# Patient Record
Sex: Female | Born: 1965 | Race: Asian | Hispanic: No | Marital: Married | Smoking: Never smoker
Health system: Southern US, Community
[De-identification: ages and names within clinical notes are randomized; demographics above are authoritative.]

## PROBLEM LIST (undated history)

## (undated) DIAGNOSIS — I10 Essential (primary) hypertension: Secondary | ICD-10-CM

---

## 2000-09-20 ENCOUNTER — Emergency Department (HOSPITAL_COMMUNITY): Admission: EM | Admit: 2000-09-20 | Discharge: 2000-09-20 | Payer: Self-pay | Admitting: Emergency Medicine

## 2000-09-28 ENCOUNTER — Inpatient Hospital Stay (HOSPITAL_COMMUNITY): Admission: AD | Admit: 2000-09-28 | Discharge: 2000-09-28 | Payer: Self-pay | Admitting: *Deleted

## 2000-10-28 ENCOUNTER — Encounter: Admission: RE | Admit: 2000-10-28 | Discharge: 2000-10-28 | Payer: Self-pay | Admitting: Family Medicine

## 2000-11-19 ENCOUNTER — Encounter: Admission: RE | Admit: 2000-11-19 | Discharge: 2000-11-19 | Payer: Self-pay | Admitting: Family Medicine

## 2000-11-19 ENCOUNTER — Other Ambulatory Visit: Admission: RE | Admit: 2000-11-19 | Discharge: 2000-11-19 | Payer: Self-pay | Admitting: *Deleted

## 2000-12-27 ENCOUNTER — Encounter: Admission: RE | Admit: 2000-12-27 | Discharge: 2000-12-27 | Payer: Self-pay | Admitting: Family Medicine

## 2001-01-07 ENCOUNTER — Ambulatory Visit (HOSPITAL_COMMUNITY): Admission: RE | Admit: 2001-01-07 | Discharge: 2001-01-07 | Payer: Self-pay

## 2001-01-27 ENCOUNTER — Encounter: Admission: RE | Admit: 2001-01-27 | Discharge: 2001-01-27 | Payer: Self-pay | Admitting: Family Medicine

## 2001-01-31 ENCOUNTER — Encounter: Admission: RE | Admit: 2001-01-31 | Discharge: 2001-01-31 | Payer: Self-pay | Admitting: Family Medicine

## 2001-02-02 ENCOUNTER — Encounter: Admission: RE | Admit: 2001-02-02 | Discharge: 2001-02-02 | Payer: Self-pay | Admitting: Sports Medicine

## 2001-02-14 ENCOUNTER — Encounter: Admission: RE | Admit: 2001-02-14 | Discharge: 2001-02-14 | Payer: Self-pay | Admitting: Family Medicine

## 2001-03-02 ENCOUNTER — Encounter: Admission: RE | Admit: 2001-03-02 | Discharge: 2001-03-02 | Payer: Self-pay | Admitting: Family Medicine

## 2001-03-04 ENCOUNTER — Encounter: Admission: RE | Admit: 2001-03-04 | Discharge: 2001-06-02 | Payer: Self-pay | Admitting: *Deleted

## 2001-03-14 ENCOUNTER — Encounter: Admission: RE | Admit: 2001-03-14 | Discharge: 2001-03-14 | Payer: Self-pay | Admitting: Family Medicine

## 2001-03-30 ENCOUNTER — Encounter: Admission: RE | Admit: 2001-03-30 | Discharge: 2001-03-30 | Payer: Self-pay | Admitting: Family Medicine

## 2001-04-18 ENCOUNTER — Encounter: Admission: RE | Admit: 2001-04-18 | Discharge: 2001-04-18 | Payer: Self-pay | Admitting: Family Medicine

## 2001-04-27 ENCOUNTER — Inpatient Hospital Stay (HOSPITAL_COMMUNITY): Admission: AD | Admit: 2001-04-27 | Discharge: 2001-05-05 | Payer: Self-pay | Admitting: Obstetrics

## 2001-04-27 ENCOUNTER — Encounter: Payer: Self-pay | Admitting: Obstetrics

## 2001-04-27 ENCOUNTER — Encounter: Admission: RE | Admit: 2001-04-27 | Discharge: 2001-04-27 | Payer: Self-pay | Admitting: Family Medicine

## 2001-05-10 ENCOUNTER — Encounter: Admission: RE | Admit: 2001-05-10 | Discharge: 2001-05-10 | Payer: Self-pay | Admitting: Family Medicine

## 2001-05-19 ENCOUNTER — Encounter: Admission: RE | Admit: 2001-05-19 | Discharge: 2001-05-19 | Payer: Self-pay | Admitting: Family Medicine

## 2001-05-26 ENCOUNTER — Encounter: Admission: RE | Admit: 2001-05-26 | Discharge: 2001-05-26 | Payer: Self-pay | Admitting: Family Medicine

## 2001-06-14 ENCOUNTER — Encounter: Admission: RE | Admit: 2001-06-14 | Discharge: 2001-06-14 | Payer: Self-pay | Admitting: Family Medicine

## 2001-06-27 ENCOUNTER — Encounter: Admission: RE | Admit: 2001-06-27 | Discharge: 2001-06-27 | Payer: Self-pay | Admitting: Family Medicine

## 2001-07-18 ENCOUNTER — Encounter: Payer: Self-pay | Admitting: Sports Medicine

## 2001-07-18 ENCOUNTER — Encounter: Admission: RE | Admit: 2001-07-18 | Discharge: 2001-07-18 | Payer: Self-pay | Admitting: Sports Medicine

## 2005-02-23 ENCOUNTER — Emergency Department (HOSPITAL_COMMUNITY): Admission: EM | Admit: 2005-02-23 | Discharge: 2005-02-23 | Payer: Self-pay | Admitting: Family Medicine

## 2005-02-27 ENCOUNTER — Encounter: Admission: RE | Admit: 2005-02-27 | Discharge: 2005-02-27 | Payer: Self-pay | Admitting: Cardiology

## 2006-09-16 ENCOUNTER — Ambulatory Visit (HOSPITAL_COMMUNITY): Admission: RE | Admit: 2006-09-16 | Discharge: 2006-09-16 | Payer: Self-pay | Admitting: Cardiology

## 2006-10-29 ENCOUNTER — Encounter: Admission: RE | Admit: 2006-10-29 | Discharge: 2006-10-29 | Payer: Self-pay | Admitting: Cardiology

## 2007-02-28 ENCOUNTER — Emergency Department (HOSPITAL_COMMUNITY): Admission: EM | Admit: 2007-02-28 | Discharge: 2007-02-28 | Payer: Self-pay | Admitting: Emergency Medicine

## 2007-06-22 ENCOUNTER — Emergency Department (HOSPITAL_COMMUNITY): Admission: EM | Admit: 2007-06-22 | Discharge: 2007-06-22 | Payer: Self-pay | Admitting: Emergency Medicine

## 2010-05-22 ENCOUNTER — Encounter: Admission: RE | Admit: 2010-05-22 | Discharge: 2010-05-22 | Payer: Self-pay | Admitting: Cardiology

## 2011-01-23 NOTE — Discharge Summary (Signed)
[facility]  Patient:    Kristie Walker, Kristie Walker Visit Number: [id number] MRN: [id number]          Service Type: OBS Location: 910A [id number] [id number] Attending Physician:  Tammi Sou Dictated by:   Emelda Fear, M.D. Adm. Date:  04/27/2001 Disc. Date: 05/05/2001                             Discharge Summary  DATE OF BIRTH:                1966/04/21  ADMITTING DIAGNOSES:          1. Intrauterine pregnancy at 37 and [redacted] weeks                                  gestation.                               2. Preeclampsia.                               3. Oligohydramnios.  DISCHARGE DIAGNOSES:          1. Vacuum assisted vaginal delivery.                               2. Preeclampsia.  FELLOW:                       Ed Blalock. Burnadette Peter, M.D.  HOSPITAL COURSE:              [name] is a 45 year old G1, P0 presenting at 67 and 4 weeks for admission secondary to oligohydramnios and preeclampsia. Patient was admitted for cervical ripening and labor augmentation in addition to magnesium sulfate supplementation secondary to her preeclampsia.  Patient was admitted with difficulty in cervical ripening and augmentation.  Following such efforts patient spontaneously ruptured membranes.  Patient was initiated on magnesium sulfate postpartum.  Blood pressures upon admission ranged from 123-170/75-110.  Patient did well postpartum and was taken off of the magnesium sulfate therapy.  Procardia XL 60 mg was initiated for persistent elevated blood pressures during her admission.  Patient was discharged on postpartum day #6.  DISCHARGE MEDICATIONS:        1. Procardia XL 60 mg one tablet p.o. q.d.                               2. Motrin p.r.n. for pain.                               3. Prenatal vitamins one tablet q.d. while                                  breast-feeding.                               4. Lanolin p.r.n. for nipple soreness.  WOUND CARE:  Not applicable.  DIET:                         Regular diet with increased fluids, fiber in the diet.  ACTIVITY:                     No strenuous activity or lifting over 10 pounds. Sexual activity:  Nothing per vagina for the following six weeks.  FOLLOW-UP:                    [facility] in six weeks for a postpartum visit and checkup.  Patient is also to follow-up Tuesday, September 3 at 9:30 p.m. at [facility] with Dr. Mikel Cella for a blood pressure evaluation and potential adjustment of Procardia.  SPECIAL INSTRUCTIONS:         Patient is recommended to call doctor if temperature develops greater than 100.4 with increased vaginal bleeding or with severe pain not adequately controlled with Motrin or Tylenol.  DISPOSITION:                  Discharged to home. Dictated by:   Emelda Fear, M.D. Attending Physician:  Tammi Sou DD:  05/05/01 TD:  05/05/01 Job: [id number]

## 2011-06-24 LAB — I-STAT 8, (EC8 V) (CONVERTED LAB)
Acid-base deficit: 1
BUN: 12
Bicarbonate: 26.1 — ABNORMAL HIGH
Chloride: 106
Glucose, Bld: 105 — ABNORMAL HIGH
HCT: 45
Hemoglobin: 15.3 — ABNORMAL HIGH
Operator id: 116391
Potassium: 4.3
Sodium: 139
TCO2: 28
pCO2, Ven: 48.8
pH, Ven: 7.335 — ABNORMAL HIGH

## 2011-06-24 LAB — POCT I-STAT CREATININE
Creatinine, Ser: 0.6
Operator id: 116391

## 2013-06-27 ENCOUNTER — Other Ambulatory Visit: Payer: Self-pay | Admitting: Cardiovascular Disease

## 2013-06-27 ENCOUNTER — Ambulatory Visit
Admission: RE | Admit: 2013-06-27 | Discharge: 2013-06-27 | Disposition: A | Discharge status: Home / Self Care | Payer: No Typology Code available for payment source | Source: Ambulatory Visit | Attending: Cardiovascular Disease | Admitting: Cardiovascular Disease

## 2013-06-27 DIAGNOSIS — M542 Cervicalgia: Secondary | ICD-10-CM

## 2013-06-27 DIAGNOSIS — M79602 Pain in left arm: Secondary | ICD-10-CM

## 2013-07-28 ENCOUNTER — Other Ambulatory Visit: Payer: Self-pay | Admitting: Cardiovascular Disease

## 2013-07-28 DIAGNOSIS — M5090 Cervical disc disorder, unspecified, unspecified cervical region: Secondary | ICD-10-CM

## 2013-08-08 ENCOUNTER — Other Ambulatory Visit: Payer: No Typology Code available for payment source

## 2014-04-09 ENCOUNTER — Encounter (HOSPITAL_COMMUNITY): Payer: Self-pay | Admitting: Emergency Medicine

## 2014-04-09 ENCOUNTER — Emergency Department (HOSPITAL_COMMUNITY)
Admission: EM | Admit: 2014-04-09 | Discharge: 2014-04-09 | Disposition: A | Discharge status: Home / Self Care | Payer: No Typology Code available for payment source | Attending: Emergency Medicine | Admitting: Emergency Medicine

## 2014-04-09 DIAGNOSIS — M549 Dorsalgia, unspecified: Secondary | ICD-10-CM | POA: Insufficient documentation

## 2014-04-09 DIAGNOSIS — M25512 Pain in left shoulder: Secondary | ICD-10-CM

## 2014-04-09 DIAGNOSIS — M25519 Pain in unspecified shoulder: Secondary | ICD-10-CM | POA: Insufficient documentation

## 2014-04-09 DIAGNOSIS — I10 Essential (primary) hypertension: Secondary | ICD-10-CM | POA: Insufficient documentation

## 2014-04-09 DIAGNOSIS — Z79899 Other long term (current) drug therapy: Secondary | ICD-10-CM | POA: Insufficient documentation

## 2014-04-09 DIAGNOSIS — M79609 Pain in unspecified limb: Secondary | ICD-10-CM | POA: Insufficient documentation

## 2014-04-09 HISTORY — DX: Essential (primary) hypertension: I10

## 2014-04-09 MED ORDER — OXYCODONE-ACETAMINOPHEN 5-325 MG PO TABS: 1.0000 | ORAL_TABLET | ORAL | Status: AC | PRN

## 2014-04-09 MED ORDER — DIAZEPAM 5 MG PO TABS
5.0000 mg | ORAL_TABLET | Freq: Once | ORAL | Status: AC
  Administered 2014-04-09: 5 mg via ORAL
  Filled 2014-04-09: qty 1

## 2014-04-09 MED ORDER — OXYCODONE-ACETAMINOPHEN 5-325 MG PO TABS
1.0000 | ORAL_TABLET | Freq: Once | ORAL | Status: AC
Start: 2014-04-09 — End: 2014-04-09
  Administered 2014-04-09: 1 via ORAL
  Filled 2014-04-09: qty 1

## 2014-04-09 MED ORDER — DIAZEPAM 5 MG PO TABS: 5.0000 mg | ORAL_TABLET | Freq: Two times a day (BID) | ORAL | Status: AC

## 2014-04-09 NOTE — ED Notes (Signed)
Pt c/o upper left back pain with radiation to arm; pt sts feels like muscle spasm and worse with certain positions

## 2014-04-09 NOTE — ED Provider Notes (Signed)
CSN: [id number]     Arrival date & time 04/09/14  1639 History  This chart was scribed for non-physician practitioner, Sharilyn Sites, PA-C, working with Doug Sou, MD by Charline Bills, ED Scribe. This patient was seen in room TR07C/TR07C and the patient's care was started at 6:07 PM.   Chief Complaint  Patient presents with  . Arm Pain  . Back Pain   The history is provided by the patient. No language interpreter was used.   HPI Comments: Kristie Walker is a 48 y.o. female who presents to the Emergency Department complaining of L shoulder pain that has been intermittent for the past several weeks. Pain is exacerbated with lifting her L arm and lying on her left side.  Pt states that she feels like she needs to pop her shoulder and that her muscles are tight. She denies falls, injury, previous injury.  Denies any numbness, paresthesias, or weakness of left arm. No prior left shoulder injuries or surgeries. She has tried a heating pad with temporary relief. Pt states that she has seen her PCP for similar symptoms and was prescribed Flexeril that she is not taking because it makes her too drowsy. Pt is R hand dominant. No h/o heart complications.  Denies current chest pain, SOB, palpitations, dizziness, weakness, recent travel, LE edema, calf pain.  No hx DVT or PE.  No hx IVDU or cancer. VS stable on arrival.  Past Medical History  Diagnosis Date  . Hypertension    History reviewed. No pertinent past surgical history. History reviewed. No pertinent family history. History  Substance Use Topics  . Smoking status: Never Smoker   . Smokeless tobacco: Not on file  . Alcohol Use: No   OB History   Grav Para Term Preterm Abortions TAB SAB Ect Mult Living                 Review of Systems  Musculoskeletal: Positive for back pain and myalgias.  All other systems reviewed and are negative.  Allergies  Review of patient's allergies indicates no known allergies.  Home Medications   Prior  to Admission medications   Medication Sig Start Date End Date Taking? Authorizing Provider  ALPRAZolam Prudy Feeler) 0.25 MG tablet Take 0.25 mg by mouth at bedtime as needed for anxiety.   Yes Historical Provider, MD  Menthol, Topical Analgesic, 5 % PADS Apply 1 application topically daily as needed. For pain   Yes Historical Provider, MD  telmisartan (MICARDIS) 80 MG tablet Take 40 mg by mouth daily.   Yes Historical Provider, MD   Triage Vitals: BP 171/84  Pulse 101  Temp(Src) 98.3 F (36.8 C) (Oral)  Resp 18  SpO2 99% Physical Exam  Nursing note and vitals reviewed. Constitutional: She is oriented to person, place, and time. She appears well-developed and well-nourished.  HENT:  Head: Normocephalic and atraumatic.  Mouth/Throat: Oropharynx is clear and moist.  Eyes: Conjunctivae and EOM are normal. Pupils are equal, round, and reactive to light.  Neck: Normal range of motion.  Cardiovascular: Normal rate, regular rhythm and normal heart sounds.   Pulmonary/Chest: Effort normal and breath sounds normal.  Abdominal: Soft. Bowel sounds are normal.  Musculoskeletal: Normal range of motion.       Left shoulder: She exhibits tenderness. She exhibits no deformity.  L trapezius tender to palpation without noted bony deformities; skin normal in appearance Pain reproducible with ROM of L shoulder, notably with forward flexion and abduction Normal strength throughout LUE, Strong radial pulse  and capillary refill  Neurological: She is alert and oriented to person, place, and time.  Skin: Skin is warm and dry.  Psychiatric: She has a normal mood and affect.   ED Course  Procedures (including critical care time) DIAGNOSTIC STUDIES: Oxygen Saturation is 99% on RA, normal by my interpretation.    COORDINATION OF CARE: 6:12 PM-Discussed treatment plan which includes medication for pain with pt at bedside and pt agreed to plan.   Labs Review Labs Reviewed - No data to display  Imaging  Review No results found.   EKG Interpretation None      MDM   Final diagnoses:  Left shoulder pain   48 year old female with intermittent left shoulder pain over the past several weeks. On exam, pain is reproducible with palpation and range of motion of left shoulder. Arm remains NVI.  Patient has no prior cardiac history and currently denies any chest pain, shortness of breath, palpitations. She is low risk for PE per Well's criteria. Her only cardiac risk factor is hypertension. At this time I have low suspicion for atypical presentation of HTN, PE, dissection, or other acute cardiac event.  Patient was given dose of Percocet and Valium in the emergency department with improvement of symptoms, will discharge home with the same. She will follow with her primary care physician.  Discussed plan with patient, he/she acknowledged understanding and agreed with plan of care.  Return precautions given for new or worsening symptoms.  I personally performed the services described in this documentation, which was scribed in my presence. The recorded information has been reviewed and is accurate.  Garlon HatchetLisa M Tamelia Michalowski, PA-C 04/09/14 2217

## 2014-04-09 NOTE — Discharge Instructions (Signed)
Take the prescribed medication as directed.  May continue applying heat to affected areas to help with pain. Follow-up with your primary care physician. Return to the ED for new or worsening symptoms.

## 2014-04-10 NOTE — ED Provider Notes (Signed)
Medical screening examination/treatment/procedure(s) were performed by non-physician practitioner and as supervising physician I was immediately available for consultation/collaboration.   EKG Interpretation None       Doug SouSam Keia Rask, MD 04/10/14 [id number]

## 2014-04-13 ENCOUNTER — Ambulatory Visit
Admission: RE | Admit: 2014-04-13 | Discharge: 2014-04-13 | Disposition: A | Discharge status: Home / Self Care | Payer: Self-pay | Source: Ambulatory Visit | Attending: Cardiovascular Disease | Admitting: Cardiovascular Disease

## 2014-04-13 ENCOUNTER — Other Ambulatory Visit: Payer: Self-pay | Admitting: Cardiovascular Disease

## 2014-04-13 DIAGNOSIS — M25512 Pain in left shoulder: Secondary | ICD-10-CM

## 2019-09-14 ENCOUNTER — Other Ambulatory Visit: Payer: Self-pay | Admitting: Cardiovascular Disease

## 2019-09-14 ENCOUNTER — Ambulatory Visit
Admission: RE | Admit: 2019-09-14 | Discharge: 2019-09-14 | Disposition: A | Discharge status: Home / Self Care | Payer: No Typology Code available for payment source | Source: Ambulatory Visit | Attending: Cardiovascular Disease | Admitting: Cardiovascular Disease

## 2019-09-14 DIAGNOSIS — R202 Paresthesia of skin: Secondary | ICD-10-CM

## 2019-09-14 DIAGNOSIS — R200 Anesthesia of skin: Secondary | ICD-10-CM

## 2020-02-13 ENCOUNTER — Other Ambulatory Visit: Payer: Self-pay

## 2020-02-13 ENCOUNTER — Ambulatory Visit (HOSPITAL_COMMUNITY)
Admission: EM | Admit: 2020-02-13 | Discharge: 2020-02-13 | Disposition: A | Discharge status: Home / Self Care | Payer: BLUE CROSS/BLUE SHIELD | Attending: Physician Assistant | Admitting: Physician Assistant

## 2020-02-13 ENCOUNTER — Ambulatory Visit (INDEPENDENT_AMBULATORY_CARE_PROVIDER_SITE_OTHER): Payer: BLUE CROSS/BLUE SHIELD

## 2020-02-13 DIAGNOSIS — S92515A Nondisplaced fracture of proximal phalanx of left lesser toe(s), initial encounter for closed fracture: Secondary | ICD-10-CM

## 2020-02-13 DIAGNOSIS — S20212A Contusion of left front wall of thorax, initial encounter: Secondary | ICD-10-CM | POA: Diagnosis not present

## 2020-02-13 NOTE — ED Triage Notes (Signed)
Pt presents to UC after falling at mailbox today. Left foot is bruised at 4th and 5th toes. Pt states she landed on her left side, and has had left sided rib pain since.

## 2020-02-13 NOTE — ED Provider Notes (Signed)
[facility]    CSN: [id number] Arrival date & time: 02/13/20  1927      History   Chief Complaint Chief Complaint  Patient presents with  . Foot Injury    HPI Kristie Walker is a 54 y.o. female.   Patient reports to urgent care for evaluation of left foot injury and left-sided rib pain.  She reports earlier today around 1 PM she tripped and rolled her left foot and fell backwards onto her couch.  After this she had pain in her fourth and fifth toes and in the foot just before the fourth and fifth toes.  She noticed bruising.  It is painful to walk.  She reports this is pain up to 8 out of 10.  She also reports some left-sided upper rib pain.  She reports she fell onto the ribs onto the couch.  She reports this pain is not severe.  Denies difficulty breathing.  Denies pain with breathing.  She denies other injuries.  Denies hitting her head.     Past Medical History:  Diagnosis Date  . Hypertension     There are no problems to display for this patient.   No past surgical history on file.  OB History   No obstetric history on file.      Home Medications    Prior to Admission medications   Medication Sig Start Date End Date Taking? Authorizing Provider  ALPRAZolam Prudy Feeler) 0.25 MG tablet Take 0.25 mg by mouth at bedtime as needed for anxiety.    [provider]  diazepam (VALIUM) 5 MG tablet Take 1 tablet (5 mg total) by mouth 2 (two) times daily. 04/09/14   Garlon Hatchet, PA-C  Menthol, Topical Analgesic, 5 % PADS Apply 1 application topically daily as needed. For pain    [provider]  oxyCODONE-acetaminophen (PERCOCET/ROXICET) 5-325 MG per tablet Take 1 tablet by mouth every 4 (four) hours as needed. 04/09/14   Garlon Hatchet, PA-C  telmisartan (MICARDIS) 80 MG tablet Take 40 mg by mouth daily.    [provider]    Family History No family history on file.  Social History Social History   Tobacco Use  . Smoking status:  Never Smoker  Substance Use Topics  . Alcohol use: No  . Drug use: No     Allergies   Patient has no known allergies.   Review of Systems Review of Systems   Physical Exam Triage Vital Signs ED Triage Vitals [02/13/20 1938]  Enc Vitals Group     BP 122/67     Pulse Rate 67     Resp 18     Temp 98.2 F (36.8 C)     Temp Source Tympanic     SpO2 100 %     Weight      Height      Head Circumference      Peak Flow      Pain Score 8     Pain Loc      Pain Edu?      Excl. in GC?    No data found.  Updated Vital Signs BP 122/67 (BP Location: Right Arm)   Pulse 67   Temp 98.2 F (36.8 C) (Tympanic)   Resp 18   LMP 06/06/2013   SpO2 100%   Visual Acuity Right Eye Distance:   Left Eye Distance:   Bilateral Distance:    Right Eye Near:   Left Eye Near:  Bilateral Near:     Physical Exam Vitals and nursing note reviewed.  Constitutional:      General: She is not in acute distress.    Appearance: She is well-developed.  HENT:     Head: Normocephalic and atraumatic.  Eyes:     Conjunctiva/sclera: Conjunctivae normal.  Cardiovascular:     Rate and Rhythm: Normal rate.     Heart sounds: No murmur.  Pulmonary:     Effort: Pulmonary effort is normal.     Comments: There is some tenderness in the left upper mid axillary region.  No pain with thoracic compression.  No pain elicited with deep inspiration.  No rashes or bruising or axillary area Musculoskeletal:     Cervical back: Neck supple.     Comments: Left foot: No obvious deformity.  Ecchymosis of the fourth and fifth digit.  There is ecchymosis the distal aspect of the fourth and fifth metatarsals.  There is tenderness over the distal fourth and fifth metatarsals.  Patient is able to move the toes.  Significant pain with weightbearing.  No proximal foot tenderness or base of fifth tenderness.  Cap refill less than 2 seconds.  Skin:    General: Skin is warm and dry.  Neurological:     Mental Status: She  is alert.      UC Treatments / Results  Labs (all labs ordered are listed, but only abnormal results are displayed) Labs Reviewed - No data to display  EKG   Radiology DG Foot Complete Left  Result Date: 02/13/2020 CLINICAL DATA:  Left foot pain after a fall with a twisting injury today. Initial encounter. EXAM: LEFT FOOT - COMPLETE 3+ VIEW COMPARISON:  None. FINDINGS: There is a transverse fracture through the proximal metaphysis of the proximal phalanx of the little toe. The fracture is nondisplaced. Imaged bones otherwise appear normal. Soft tissues are unremarkable. IMPRESSION: Acute, nondisplaced fracture proximal metaphysis of the proximal phalanx of the little toe. Electronically Signed   By: Drusilla Kanner M.D.   On: 02/13/2020 20:17    Procedures Procedures (including critical care time)  Medications Ordered in UC Medications - No data to display  Initial Impression / Assessment and Plan / UC Course  I have reviewed the triage vital signs and the nursing notes.  Pertinent labs & imaging results that were available during my care of the patient were reviewed by me and considered in my medical decision making (see chart for details).     #Left fifth digit fracture #Rib contusion Patient is a 54 year old presenting with nondisplaced closed fracture of the proximal phalanx of the fifth digit on the left foot and rib contusion.  Low suspicion for rib fracture.  Neurovascularly intact in the foot.  Will place in walking boot/postop shoe.  Appears patient's primary care is a cardiologist will have her follow-up with sports medicine for reevaluation in about 1 week.  Discussed use of postop boot to ensure lack of flexion extension of the toe.  Discussed ice and elevation tonight and tomorrow.  Ibuprofen and Tylenol for pain relief.  Return follow-up precautions were discussed.  Patient verbalized understanding Final Clinical Impressions(s) / UC Diagnoses   Final diagnoses:    Closed nondisplaced fracture of proximal phalanx of lesser toe of left foot, initial encounter  Contusion of rib on left side, initial encounter     Discharge Instructions     Wear the boot supplied, you may cover this to meet work requirements -you may also wear hard/stiff  soled shoes and tape your little toe and your next toe. - ice and elevate the foot tonight and tomorrow  Follow up with either your primary doctor or the sports medicine group at your preference, but you should have re-evaluation in about 7 days  Take tylenol and ibuprofen for pain, 2 regular strength every 6-8 hours  If you develop severe rib pain or shortness of breath return      ED Prescriptions    None     PDMP not reviewed this encounter.   Purnell Shoemaker, PA-C 02/13/20 2050

## 2020-02-13 NOTE — Discharge Instructions (Addendum)
Wear the boot supplied, you may cover this to meet work requirements -you may also wear hard/stiff soled shoes and tape your little toe and your next toe. - ice and elevate the foot tonight and tomorrow  Follow up with either your primary doctor or the sports medicine group at your preference, but you should have re-evaluation in about 7 days  Take tylenol and ibuprofen for pain, 2 regular strength every 6-8 hours  If you develop severe rib pain or shortness of breath return

## 2021-02-13 IMAGING — DX DG FOOT COMPLETE 3+V*L*
3 series · 3 of 3 positions shown · non-contrast
Comparison: None.

CLINICAL DATA: Left foot pain after a fall with a twisting injury
today. Initial encounter.

EXAM:
LEFT FOOT - COMPLETE 3+ VIEW

[foot ap]
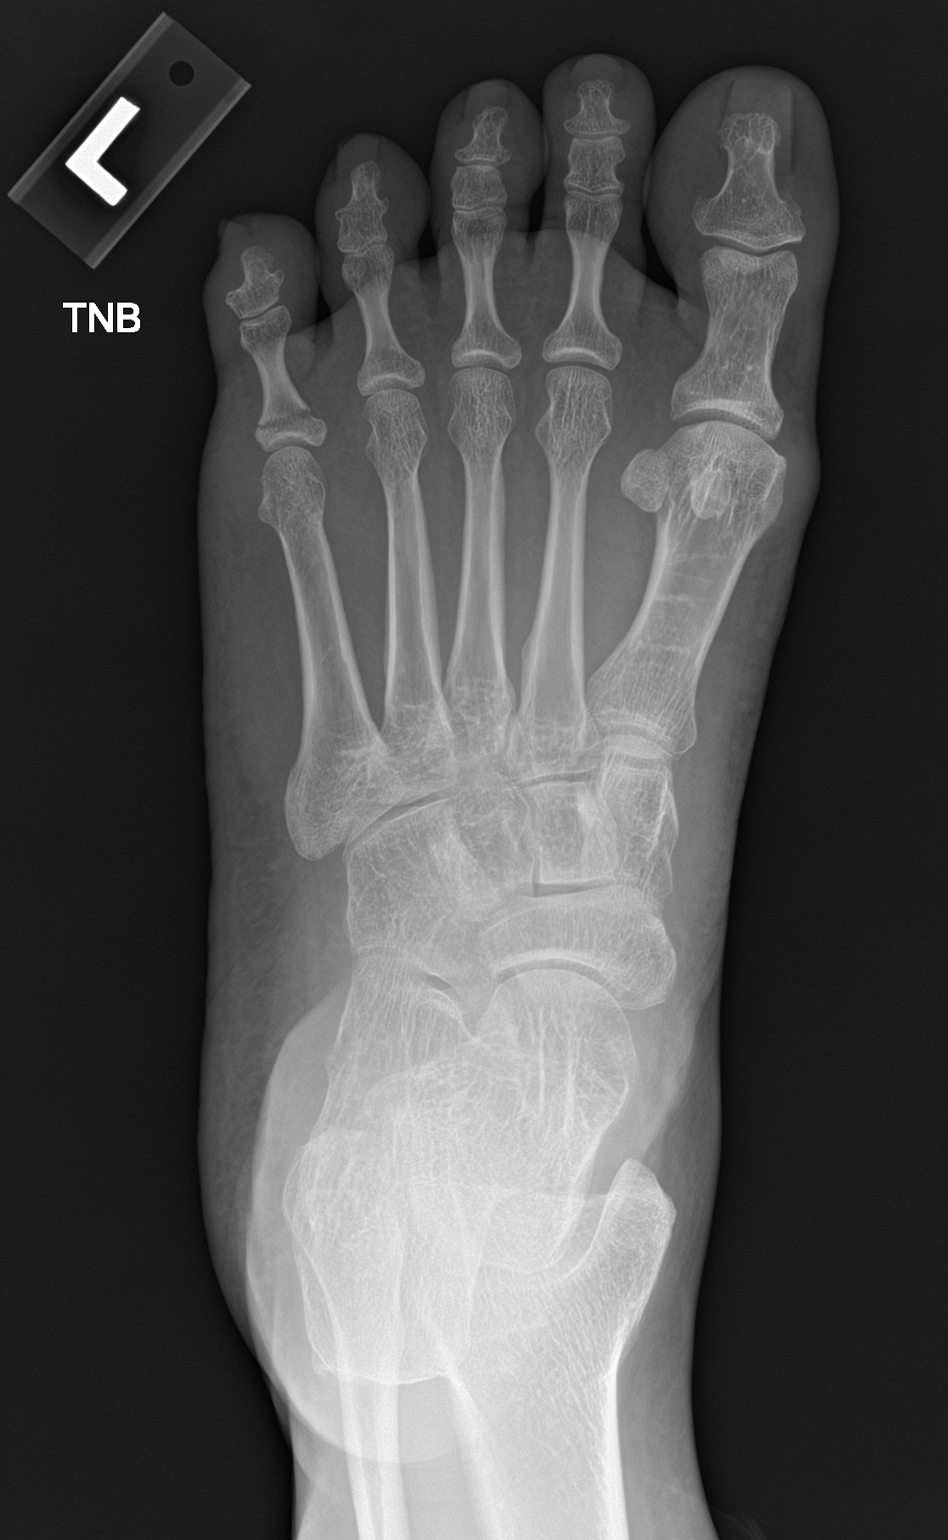

[foot obl]
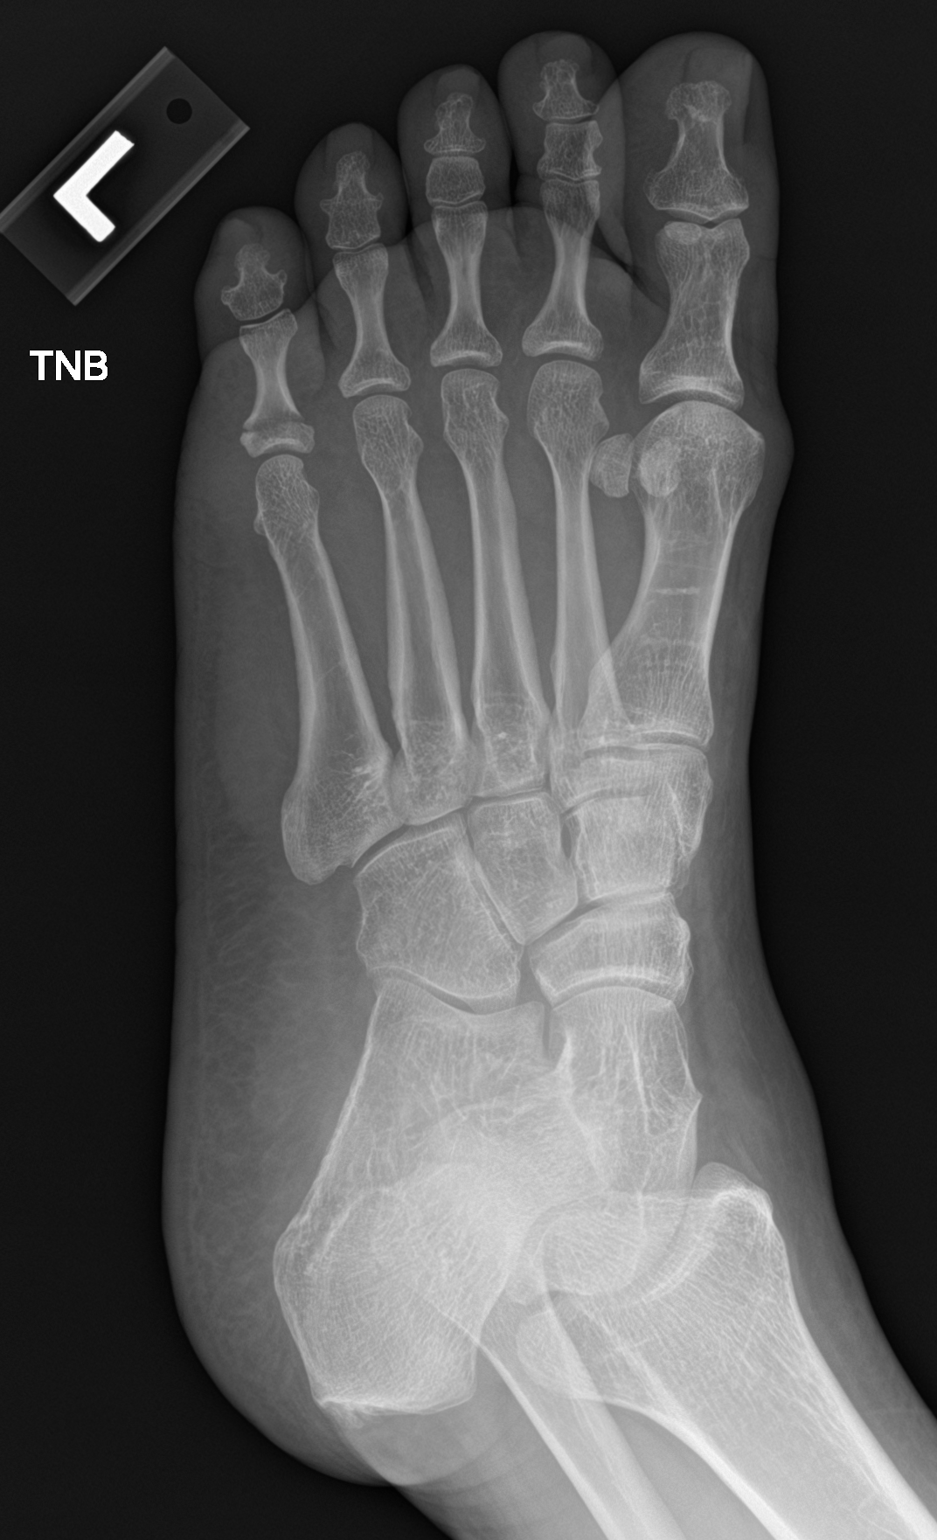

[foot lat]
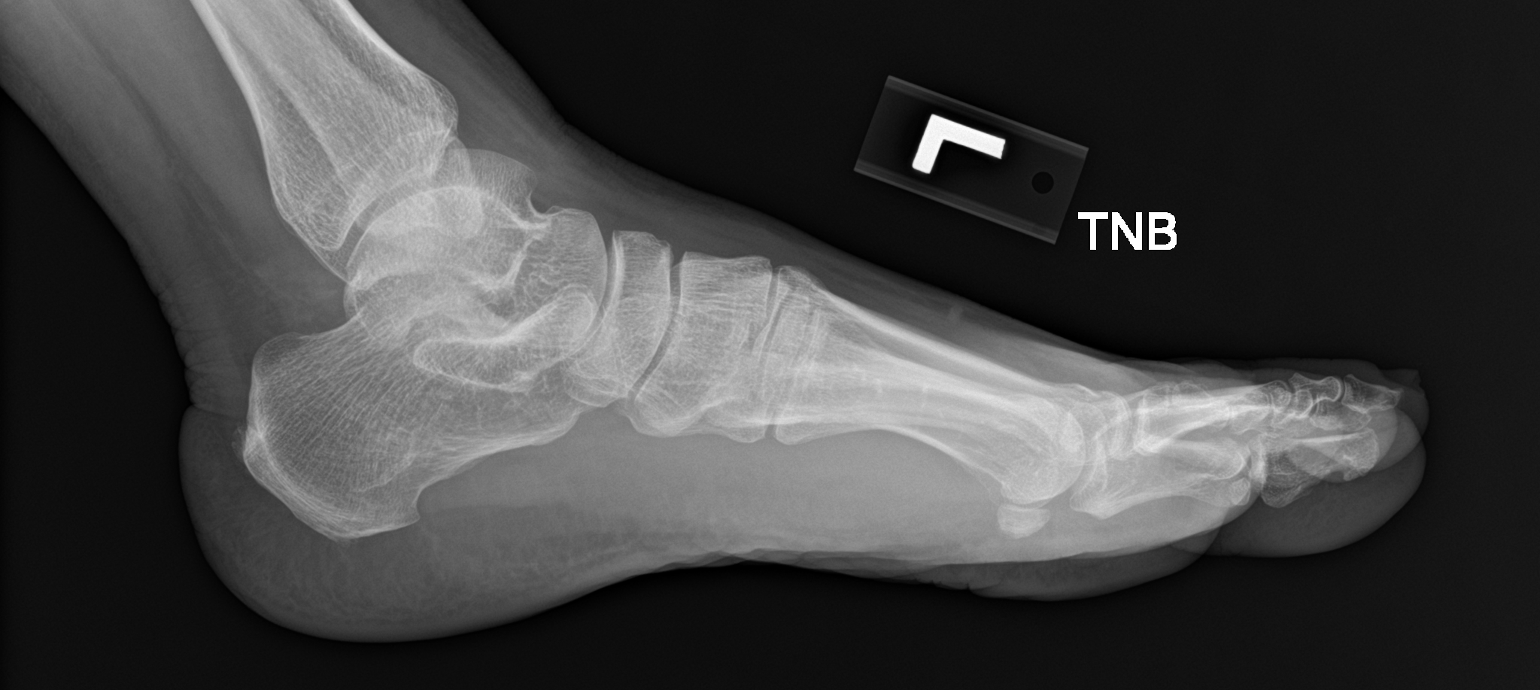

[3 of 3 positions shown; findings below may reference images not displayed]

FINDINGS: There is a transverse fracture through the proximal metaphysis of
the proximal phalanx of the little toe. The fracture is
nondisplaced. Imaged bones otherwise appear normal. Soft tissues are
unremarkable.
IMPRESSION: Acute, nondisplaced fracture proximal metaphysis of the proximal
phalanx of the little toe.

## 2024-08-15 ENCOUNTER — Other Ambulatory Visit: Payer: Self-pay

## 2024-08-15 ENCOUNTER — Emergency Department (HOSPITAL_COMMUNITY)
Admission: EM | Admit: 2024-08-15 | Discharge: 2024-08-16 | Disposition: A | Attending: Emergency Medicine | Admitting: Emergency Medicine

## 2024-08-15 LAB — BASIC METABOLIC PANEL WITH GFR
Anion gap: 10 (ref 5–15)
BUN: 7 mg/dL (ref 6–20)
CO2: 25 mmol/L (ref 22–32)
Calcium: 9.2 mg/dL (ref 8.9–10.3)
Chloride: 106 mmol/L (ref 98–111)
Creatinine, Ser: 0.68 mg/dL (ref 0.44–1.00)
GFR, Estimated: >60 mL/min (ref >60)
Glucose, Bld: 126 mg/dL — ABNORMAL HIGH (ref 70–99)
Potassium: 3.6 mmol/L (ref 3.5–5.1)
Sodium: 141 mmol/L (ref 135–145)

## 2024-08-15 LAB — CBC
HCT: 40.7 % (ref 36.0–46.0)
Hemoglobin: 13.2 g/dL (ref 12.0–15.0)
MCH: 26.3 pg (ref 26.0–34.0)
MCHC: 32.4 g/dL (ref 30.0–36.0)
MCV: 81.1 fL (ref 80.0–100.0)
Platelets: 220 K/uL (ref 150–400)
RBC: 5.02 MIL/uL (ref 3.87–5.11)
RDW: 13.2 % (ref 11.5–15.5)
WBC: 6.5 K/uL (ref 4.0–10.5)
nRBC: 0 % (ref 0.0–0.2)

## 2024-08-15 NOTE — ED Triage Notes (Addendum)
 Pt reports BP 200s systolic at home. 2 days ago pt also started having headache and neck pain radiating  down to the left flank. Denies Cp and N/V

## 2024-08-15 NOTE — ED Triage Notes (Signed)
 Pt c/o hypertension and neck pain x 3 days, states that her BP was 200/88 at home.

## 2024-08-16 NOTE — ED Provider Notes (Signed)
  EMERGENCY DEPARTMENT AT [facility] Provider Note  CSN: [id number] Arrival date & time: 08/15/24 2231  Chief Complaint(s) Hypertension  HPI & MDM Kristie Walker is a 58 y.o. female here for right-sided neck pain and lower back pain.  Intermittent in nature.  Worse with certain movements and palpation.  Noted her blood pressures were elevated with systolics in the 180s to 190s.  She reports that she has missed 2 of her blood pressure medicines over the past month.  She reports that one of them was filled 2 weeks ago and she just received her amlodipine yesterday.  Patient denies any overt headache.  No chest pain or shortness of breath.  No abdominal pain.  No other physical complaints.  The history is provided by the patient.  Hypertension    Clinical Course as of 08/16/24 0533  Wed Aug 16, 2024  0529 Elevated blood pressures in the setting of missed doses.  Right-sided neck and lower back pain are intermittent and consistent with muscle strain.  No headache concerning for ICH.  Patient denies any chest pain or shortness of breath concerning for cardiac endorgan damage.  EKG was reassuring as well.  CBC without leukocytosis or anemia.  Metabolic panel without electrolyte derangements or renal sufficiency.  Patient's blood pressure trended down after she took her nighttime dose of amlodipine.  She currently denies any neck or back pain concerning for any serious etiologies requiring additional workup at this time. [PC]    Clinical Course User Index [PC] Marlyn Rabine, Raynell Moder, MD   Medical Decision Making Amount and/or Complexity of Data Reviewed Labs: ordered. Decision-making details documented in ED Course. ECG/medicine tests: ordered and independent interpretation performed. Decision-making details documented in ED Course.    Final Clinical Impression(s) / ED Diagnoses Final diagnoses:  Elevated blood pressure reading  Muscle strain   The patient  appears reasonably screened and/or stabilized for discharge and I doubt any other medical condition or other [facility] requiring further screening, evaluation, or treatment in the ED at this time. I have discussed the findings, Dx and Tx plan with the patient/family who expressed understanding and agree(s) with the plan. Discharge instructions discussed at length. The patient/family was given strict return precautions who verbalized understanding of the instructions. No further questions at time of discharge.  Disposition: Discharge  Condition: Good  ED Discharge Orders     None       Follow Up: Claudene Pacific, MD [address] [place] [address] [phone]  Call  to schedule an appointment for close follow up     Past Medical History Past Medical History:  Diagnosis Date   Hypertension    There are no active problems to display for this patient.  Home Medication(s) Prior to Admission medications   Medication Sig Start Date End Date Taking? Authorizing Provider  ALPRAZolam (XANAX) 0.25 MG tablet Take 0.25 mg by mouth at bedtime as needed for anxiety.    [provider]  diazepam  (VALIUM ) 5 MG tablet Take 1 tablet (5 mg total) by mouth 2 (two) times daily. 04/09/14   Jarold Olam HERO, PA-C  Menthol, Topical Analgesic, 5 % PADS Apply 1 application topically daily as needed. For pain    [provider]  oxyCODONE -acetaminophen  (PERCOCET/ROXICET) 5-325 MG per tablet Take 1 tablet by mouth every 4 (four) hours as needed. 04/09/14   Jarold Olam HERO, PA-C  telmisartan (MICARDIS) 80 MG tablet Take 40 mg by mouth daily.    [provider]                                                                                                                                    Allergies Patient has no known allergies.  Review of Systems Review of Systems As noted in HPI  Physical Exam Vital Signs  I have reviewed the triage vital signs BP (!) 161/85   Pulse 63    Temp 98.1 F (36.7 C)   Resp 16   Ht 5' (1.524 m)   Wt 68 kg   LMP 06/06/2013   SpO2 100%   BMI 29.29 kg/m   Physical Exam Vitals reviewed.  Constitutional:      General: She is not in acute distress.    Appearance: She is well-developed. She is not diaphoretic.  HENT:     Head: Normocephalic and atraumatic.     Right Ear: External ear normal.     Left Ear: External ear normal.     Nose: Nose normal.  Eyes:     General: No scleral icterus.    Conjunctiva/sclera: Conjunctivae normal.  Neck:     Trachea: Phonation normal.  Cardiovascular:     Rate and Rhythm: Normal rate and regular rhythm.  Pulmonary:     Effort: Pulmonary effort is normal. No respiratory distress.     Breath sounds: No stridor.  Abdominal:     General: There is no distension.  Musculoskeletal:        General: Normal range of motion.     Cervical back: Normal range of motion.  Neurological:     Mental Status: She is alert and oriented to person, place, and time.  Psychiatric:        Behavior: Behavior normal.     ED Results and Treatments Labs (all labs ordered are listed, but only abnormal results are displayed) Labs Reviewed  BASIC METABOLIC PANEL WITH GFR - Abnormal; Notable for the following components:      Result Value   Glucose, Bld 126 (*)    All other components within normal limits  CBC                                                                                                                         EKG  EKG Interpretation Date/Time:  Tuesday August 15 2024 23:16:35 EST Ventricular Rate:  73 PR Interval:  152 QRS Duration:  82  QT Interval:  428 QTC Calculation: 471 R Axis:   70  Text Interpretation: Normal sinus rhythm Cannot rule out Anterior infarct , age undetermined Abnormal ECG No previous ECGs available Confirmed by Raford Lenis ([id number]) on 08/15/2024 11:22:43 PM       Radiology No results found.  Medications Ordered in ED Medications - No data to  display Procedures Procedures  (including critical care time)   This chart was dictated using voice recognition software.  Despite best efforts to proofread,  errors can occur which can change the documentation meaning.   Trine Raynell Moder, MD 08/16/24 [phone]

## 2024-08-16 NOTE — ED Notes (Signed)
 Patient articulated that she has not taken her antihypertensive medications for several weeks .

## 2024-08-21 ENCOUNTER — Emergency Department (HOSPITAL_COMMUNITY)
Admission: EM | Admit: 2024-08-21 | Discharge: 2024-08-21 | Disposition: A | Discharge status: Home / Self Care | Attending: Emergency Medicine | Admitting: Emergency Medicine

## 2024-08-21 ENCOUNTER — Encounter (HOSPITAL_COMMUNITY): Payer: Self-pay

## 2024-08-21 ENCOUNTER — Other Ambulatory Visit: Payer: Self-pay

## 2024-08-21 DIAGNOSIS — Z79899 Other long term (current) drug therapy: Secondary | ICD-10-CM | POA: Diagnosis not present

## 2024-08-21 DIAGNOSIS — Z760 Encounter for issue of repeat prescription: Secondary | ICD-10-CM | POA: Diagnosis not present

## 2024-08-21 DIAGNOSIS — I10 Essential (primary) hypertension: Secondary | ICD-10-CM

## 2024-08-21 LAB — CBC WITH DIFFERENTIAL/PLATELET
Abs Immature Granulocytes: 0.02 K/uL (ref 0.00–0.07)
Basophils Absolute: 0 K/uL (ref 0.0–0.1)
Basophils Relative: 1 %
Eosinophils Absolute: 0.3 K/uL (ref 0.0–0.5)
Eosinophils Relative: 5 %
HCT: 42.1 % (ref 36.0–46.0)
Hemoglobin: 13.7 g/dL (ref 12.0–15.0)
Immature Granulocytes: 0 %
Lymphocytes Relative: 23 %
Lymphs Abs: 1.4 K/uL (ref 0.7–4.0)
MCH: 26.1 pg (ref 26.0–34.0)
MCHC: 32.5 g/dL (ref 30.0–36.0)
MCV: 80.3 fL (ref 80.0–100.0)
Monocytes Absolute: 0.3 K/uL (ref 0.1–1.0)
Monocytes Relative: 6 %
Neutro Abs: 4.1 K/uL (ref 1.7–7.7)
Neutrophils Relative %: 65 %
Platelets: 264 K/uL (ref 150–400)
RBC: 5.24 MIL/uL — ABNORMAL HIGH (ref 3.87–5.11)
RDW: 13.2 % (ref 11.5–15.5)
WBC: 6.2 K/uL (ref 4.0–10.5)
nRBC: 0 % (ref 0.0–0.2)

## 2024-08-21 LAB — COMPREHENSIVE METABOLIC PANEL WITH GFR
ALT: 17 U/L (ref 0–44)
AST: 24 U/L (ref 15–41)
Albumin: 4.1 g/dL (ref 3.5–5.0)
Alkaline Phosphatase: 78 U/L (ref 38–126)
Anion gap: 11 (ref 5–15)
BUN: 11 mg/dL (ref 6–20)
CO2: 24 mmol/L (ref 22–32)
Calcium: 9.7 mg/dL (ref 8.9–10.3)
Chloride: 106 mmol/L (ref 98–111)
Creatinine, Ser: 0.66 mg/dL (ref 0.44–1.00)
GFR, Estimated: >60 mL/min (ref >60)
Glucose, Bld: 123 mg/dL — ABNORMAL HIGH (ref 70–99)
Potassium: 3.9 mmol/L (ref 3.5–5.1)
Sodium: 141 mmol/L (ref 135–145)
Total Bilirubin: 1 mg/dL (ref 0.0–1.2)
Total Protein: 7.6 g/dL (ref 6.5–8.1)

## 2024-08-21 MED ORDER — METOPROLOL SUCCINATE ER 50 MG PO TB24: 50.0000 mg | ORAL_TABLET | Freq: Every day | ORAL | 0 refills | Status: AC

## 2024-08-21 NOTE — ED Provider Notes (Signed)
 [place] EMERGENCY DEPARTMENT AT [facility] Provider Note   CSN: [id number] Arrival date & time: 08/21/24  9081     Patient presents with: Hypertension and Medication Refill   Kristie Walker is a 58 y.o. female who presents to the emergency department with a chief complaint of high blood pressure.  Patient was recently seen for same last week.  At that time patient states that she had missed some of the doses of her blood pressure medication and was also diagnosed with a muscle strain in her neck.  At this time patient had no acute finding concerning for acute intracranial process related to hypertension.  Labs were also reassuring.  Per chart review patient's blood pressure trended down after she took her nighttime dose of amlodipine.  Patient presents for reevaluation today, stating that she has attempted to follow-up with her primary care provider since her last discharge with no luck.  Patient states that she has remained compliant with her blood pressure medications however her blood pressure has remained elevated.  Patient currently denies any other symptoms, denies headache, vision changes, issues with balance or coordination.  Patient states that she did have a mild headache this morning however this resolved when taking Tylenol .  Past medical history significant for hypertension on 50 mg of losartan, 5 mg of amlodipine, 25 mg of metoprolol .  Patient denies chest pain.    Hypertension  Medication Refill      Prior to Admission medications  Medication Sig Start Date End Date Taking? Authorizing Provider  metoprolol  succinate (TOPROL -XL) 50 MG 24 hr tablet Take 1 tablet (50 mg total) by mouth daily. 08/21/24  Yes Terricka Onofrio F, PA-C  ALPRAZolam (XANAX) 0.25 MG tablet Take 0.25 mg by mouth at bedtime as needed for anxiety.    [provider]  diazepam  (VALIUM ) 5 MG tablet Take 1 tablet (5 mg total) by mouth 2 (two) times daily. 04/09/14   Jarold Olam HERO, PA-C   Menthol, Topical Analgesic, 5 % PADS Apply 1 application topically daily as needed. For pain    [provider]  oxyCODONE -acetaminophen  (PERCOCET/ROXICET) 5-325 MG per tablet Take 1 tablet by mouth every 4 (four) hours as needed. 04/09/14   Jarold Olam HERO, PA-C  telmisartan (MICARDIS) 80 MG tablet Take 40 mg by mouth daily.    [provider]    Allergies: Patient has no known allergies.    Review of Systems  Constitutional:        Hypertension    Updated Vital Signs BP (!) 168/79   Pulse 70   Temp 98.3 F (36.8 C) (Oral)   Resp 18   LMP 06/06/2013   SpO2 100%   Physical Exam Vitals and nursing note reviewed.  Constitutional:      General: She is awake. She is not in acute distress.    Appearance: Normal appearance. She is not ill-appearing, toxic-appearing or diaphoretic.  HENT:     Head: Normocephalic and atraumatic.  Eyes:     General: No visual field deficit or scleral icterus.    Extraocular Movements: Extraocular movements intact.     Conjunctiva/sclera: Conjunctivae normal.     Pupils: Pupils are equal, round, and reactive to light.  Neck:     Comments: Patient able to look left, right, touch chin to chest, and look up at the ceiling without significant discomfort Cardiovascular:     Rate and Rhythm: Normal rate and regular rhythm.  Pulmonary:     Effort: Pulmonary effort  is normal. No respiratory distress.     Breath sounds: No stridor. No wheezing, rhonchi or rales.  Musculoskeletal:        General: Normal range of motion.     Cervical back: Normal range of motion.     Right lower leg: No edema.     Left lower leg: No edema.  Skin:    General: Skin is warm.     Capillary Refill: Capillary refill takes less than 2 seconds.  Neurological:     General: No focal deficit present.     Mental Status: She is alert and oriented to person, place, and time.     GCS: GCS eye subscore is 4. GCS verbal subscore is 5. GCS motor subscore is 6.      Cranial Nerves: No dysarthria or facial asymmetry.     Sensory: Sensation is intact.     Motor: Motor function is intact.     Coordination: Coordination is intact.     Gait: Gait normal.  Psychiatric:        Mood and Affect: Mood normal.        Behavior: Behavior normal. Behavior is cooperative.     (all labs ordered are listed, but only abnormal results are displayed) Labs Reviewed  CBC WITH DIFFERENTIAL/PLATELET - Abnormal; Notable for the following components:      Result Value   RBC 5.24 (*)    All other components within normal limits  COMPREHENSIVE METABOLIC PANEL WITH GFR - Abnormal; Notable for the following components:   Glucose, Bld 123 (*)    All other components within normal limits    EKG: None  *EKG interpreted by myself with no acute ischemic changes or ST segment elevation*  Radiology: No results found.   Procedures   Medications Ordered in the ED - No data to display                                  Medical Decision Making Amount and/or Complexity of Data Reviewed Labs: ordered.  Risk Prescription drug management.   Patient presents to the ED for concern of hypertension, this involves an extensive number of treatment options, and is a complaint that carries with it a high risk of complications and morbidity.  The differential diagnosis includes hypertensive urgency, hypertensive emergency, primary hypertension, etc.   Co morbidities that complicate the patient evaluation  Hypertension   Additional history obtained:  Note reviewed from previous emergency department encounter on 08/15/2024 where patient was treated for neck muscle strain and was also noted to be hypertensive at this time as well   Lab Tests:  I Ordered, and personally interpreted labs.  The pertinent results include: CBC unremarkable, CMP unremarkable   Medicines ordered and prescription drug management:  I have reviewed the patients home medicines and have made  adjustments as needed   Test Considered:  CT head: Deferred at this time as patient has no headache, no neurologic symptoms, no focal deficit on neurologic examination, doubt acute intracranial process based off of history and physical exam   Critical Interventions:  None   Problem List / ED Course:  58 year old female, vital signs stable, presents to the emergency department with chief complaint of ongoing hypertension, patient has had trouble following up with her primary care provider Currently on losartan 50 mg, amlodipine 5 mg, metoprolol  25 mg, patient has been compliant with her outpatient medication regimen Had a slight  headache this morning but currently asymptomatic On physical exam patient asymptomatic with no focal neurodeficit, no headache currently Clinically I see no indication for head imaging at this time as patient is asymptomatic with hypertension without headache Will obtain general labs to assess for endorgan damage and also assess liver and kidney function Labs today reassuring Extensive discussion with this patient including that blood pressure is a variable thing and will increase and decrease with pain, stress, as well as other factors Reviewed patient's medications, instructed her the importance of following with her with her primary care provider, will increase metoprolol  from 25 mg to 50 mg today as patient is hypertensive on multiple blood pressure readings Return precautions given Patient discharged Most likely diagnosis at this time is asymptomatic hypertension, patient medication reviewed and increased, encouraged outpatient follow-up and continued monitoring of symptoms   Reevaluation:  After the interventions noted above, I reevaluated the patient and found that they have :stayed the same   Social Determinants of Health:  none   Dispostion:  After consideration of the diagnostic results and the patients response to treatment, I feel that the  patent would benefit from discharge and outpatient therapy as described, follow-up with primary care provider within the next 1 to 2 weeks.  Continue monitoring symptoms..       Final diagnoses:  Hypertension, unspecified type    ED Discharge Orders          Ordered    metoprolol  succinate (TOPROL -XL) 50 MG 24 hr tablet  Daily        08/21/24 [address] Rodolph Hagemann F, PA-C 08/21/24 1604    Melvenia Motto, MD 08/22/24 1556

## 2024-08-21 NOTE — ED Triage Notes (Addendum)
 Pt states her blood pressure has been high at home, was seen here last week for the same. States she tried to follow up with PCP but his office was closed and no one answering phones. Pt is compliant with prescribed medications including Losartan Amlodipine Metoprolol . Pt c/o slight headache this morning relieved by Motrin but no other pain or complaints. Pt states she has been taking these medications for over 20 years and BP has been controlled but they are no longer working right for her.

## 2024-08-21 NOTE — Discharge Instructions (Signed)
 It was a pleasure taking care of you today.  Based on your history, physical exam, as well as your labs I feel you are safe for discharge.  I have sent in a prescription for your metoprolol  to your pharmacy. You may finish out your remaining bottle taking 2 of your 25mg  tablets to make for a total of 50mg  of metoprolol  daily. Please remain compliant with your other blood pressure medications as well.  Please continue to monitor your symptoms.  If you develop headache, vision changes, issues of balance or coordination, passing out, or other concerning symptom please return to the emergency department or seek further medical evaluation.  I do recommend that you follow-up with your primary care provider within the next 1 to 2 weeks for reevaluation, please make them aware that we have increased the dose of your metoprolol .  If symptoms worsen recommend follow-up within 48 hours. Please monitor your blood pressure at home, if you develop any new or concerning symptoms with this increased dose please talk to your primary care provider.

## 2024-08-21 NOTE — ED Provider Triage Note (Signed)
 Emergency Medicine Provider Triage Evaluation Note  Kristie Walker , a 58 y.o. female  was evaluated in triage.  Pt complains of elevated blood pressure.  Patient was seen approximately 1 week ago for the same, has attempted to follow-up with her primary care doctor without much help.  States that she has been compliant with her blood pressure medications and has not missed any doses however her blood pressure has remained elevated.  Patient states earlier when she took her blood pressure it was in the 200s systolic and then on repeat her blood pressure had dropped down into the 170s.  Patient denies headache or vision changes.  Denies chest pain or shortness of breath.  Patient states she feels like some of her blood pressure medications may need to be adjusted.  Review of Systems  Positive: Elevated blood pressure Negative: Headache, fever, chills, chest pain, shortness of breath  Physical Exam  BP (!) 177/77 (BP Location: Right Arm)   Pulse 97   Temp 98.3 F (36.8 C)   Resp 17   LMP 06/06/2013   SpO2 99%  Gen:   Awake, no distress   Resp:  Normal effort  MSK:   Moves extremities without difficulty  Other:    Medical Decision Making  Medically screening exam initiated at 10:02 AM.  Appropriate orders placed.  [name] was informed that the remainder of the evaluation will be completed by another provider, this initial triage assessment does not replace that evaluation, and the importance of remaining in the ED until their evaluation is completed  Orders: CBC, CMP, EKG   Janetta Terrall FALCON, PA-C 08/21/24 1004

## 2024-08-21 NOTE — ED Notes (Signed)
 Nurse Assessment: Pt denies physical symptoms.
